# Patient Record
Sex: Male | Born: 1975 | Race: White | Hispanic: No | Marital: Married | Smoking: Never smoker
Health system: Southern US, Community
[De-identification: ages and names within clinical notes are randomized; demographics above are authoritative.]

## PROBLEM LIST (undated history)

## (undated) DIAGNOSIS — N200 Calculus of kidney: Secondary | ICD-10-CM

## (undated) HISTORY — PX: APPENDECTOMY: SHX54

## (undated) HISTORY — PX: LITHOTRIPSY: SUR834

---

## 2007-07-03 ENCOUNTER — Inpatient Hospital Stay (HOSPITAL_COMMUNITY): Admission: EM | Admit: 2007-07-03 | Discharge: 2007-07-04 | Payer: Self-pay | Admitting: Emergency Medicine

## 2007-07-03 ENCOUNTER — Encounter (INDEPENDENT_AMBULATORY_CARE_PROVIDER_SITE_OTHER): Payer: Self-pay | Admitting: General Surgery

## 2010-08-26 NOTE — Op Note (Signed)
Paul Sosa, Paul Sosa NO.:  [id number]   MEDICAL RECORD NO.:  [id number]          PATIENT TYPE:  INP   LOCATION:  0098                         FACILITY:  [facility]   PHYSICIAN:  Angelia Mould. Derrell Lolling, M.D.DATE OF BIRTH:  09-14-1975   DATE OF PROCEDURE:  07/03/2007  DATE OF DISCHARGE:                               OPERATIVE REPORT   PREOPERATIVE DIAGNOSIS:  Acute appendicitis.   POSTOPERATIVE DIAGNOSIS:  Acute appendicitis.   OPERATION PERFORMED:  Laparoscopic appendectomy.   SURGEON:  Angelia Mould. Derrell Lolling, M.D.   OPERATIVE INDICATIONS:  This is a 35 year old white man who presented to  the [facility] emergency room last night with a 2-Horger history of right  lower quadrant pain.  No nausea, no vomiting, no fever.  He had a  history of kidney stones in the past.  A CT scan was obtained which  showed acute appendicitis and no signs of kidney stones.  White blood  cell count is 15,000.  I was called to see him.  He was evaluated in the  emergency department and brought directly to the operating room.   OPERATIVE FINDINGS:  The appendix was acutely inflamed.  It was adherent  to the right lateral pelvic sidewall, and a loop of small bowel was  acutely adherent to this.  We were able to tease this off without any  problem, however.  The last 2 or 3 feet of terminal ileum looked normal.  The cecum, right colon, liver, gallbladder, otherwise looked fine.   OPERATIVE TECHNIQUE:  Following the induction of general endotracheal  anesthesia, the patient's abdomen was prepped and draped in a sterile  fashion.  Intravenous antibiotics had been given preop in the emergency  room.  The patient was identified as correct patient and correct  procedure.  0.5% Marcaine with epinephrine was used as a local  infiltration anesthetic.   A 12-mm optical port was placed in the left rectus sheath just above the  umbilicus.  This insertion was atraumatic.  Pneumoperitoneum was  created.  The  video camera was inserted, with visualization and findings  as described above.  A 5-mm trocar was placed in the left rectus sheath  just below the umbilicus, and a 12-mm trocar was placed in the  suprapubic area.  We slowly teased the terminal ileum off of the  appendix and were able to mobilize that back to the midline.  We were  able to identify the appendix, the appendiceal mesentery and the  junction of the appendix with the cecum.  We slowly teased the appendix  away from the right lateral pelvic sidewall, and we could then elevate  it with a grasper.  The appendiceal mesentery was then taken down using  the Harmonic scalpel.  We did this in several steps until we could  clearly identify the junction of the appendix with the cecum.  An Endo-  GIA stapling device was placed transversely across the base of the  appendix at the level of the cecum.  This was closed, held in place for  30 seconds, and then fired and  removed.  The appendix was placed in a  specimen bag and removed.  The operative field was irrigated with  saline.  The staple line looked very secure.  There was no bleeding.  We  irrigated out the pelvis and the right paracolic gutter just a little  bit and removed all the irrigation fluid.  The omentum was brought down  and placed over the staple line.  The trocars were removed under direct  vision.  There was no bleeding from trocar sites.  Pneumoperitoneum was  released.  The skin incisions were closed with subcuticular sutures of 4-  0 Monocryl and Dermabond.   Clean bandages were placed and the patient taken to the recovery room in  stable condition.  Estimated blood loss was about 10 mL.  Complications  none.  Sponge, needle and instrument counts were correct.      Angelia Mould. Derrell Lolling, M.D.  Electronically Signed     HMI/MEDQ  D:  07/03/2007  T:  07/03/2007  Job:  [id number]

## 2010-08-26 NOTE — H&P (Signed)
Paul Sosa, Paul Sosa NO.:  [id number]   MEDICAL RECORD NO.:  [id number]          PATIENT TYPE:  INP   LOCATION:  0098                         FACILITY:  [facility]   PHYSICIAN:  Angelia Mould. Derrell Lolling, M.D.DATE OF BIRTH:  [birth year]   DATE OF ADMISSION:  07/03/2007  DATE OF DISCHARGE:                              HISTORY & PHYSICAL   CHIEF COMPLAINT:  Right lower quadrant abdominal pain.   HISTORY OF PRESENT ILLNESS:  This is a 35 year old white man that I was  asked to see by Dr. Lorre Nick for evaluation of possible  appendicitis.   The patient came to the emergency room earlier this morning.  He had  been having right lower quadrant abdominal pain for 2 days.  This has  been getting gradually worse.  He denies nausea or vomiting.  He last  ate at 4 p.m. yesterday.  His bowel movements have been normal.  No  diarrhea.  No fever no chills.   Notable in his history is that 1 week ago he had a viral illness marked  by myalgias, chills and cough which now seems to have resolved.   Because of a history of kidney stones he had a noncontrast CT looking  for kidney stone, but there was no evidence of kidney stone or urologic  problem, but according to Dr. Lowella Dandy in radiology he clearly has  appendicitis with a small appendicolith and inflammatory changes around  the appendix but no evidence of rupture or abscess.  I was called to see  him and I have reviewed the CT scan with radiology.  He is being  admitted for management of his acute appendicitis.   PAST HISTORY:  Two episodes of kidney stones.  Wisdom teeth removed.  Otherwise, no medical or surgical problems.   MEDICATIONS:  None.   DRUG ALLERGIES:  None known.   FAMILY HISTORY:  Mother and father living and well.  Two brothers living  and well.   SOCIAL HISTORY:  He is married.  They have no children.  Denies the use  of tobacco.  Drinks alcohol occasionally.  He is currently unemployed,  having been laid off  from [employer] just last week.   REVIEW OF SYSTEMS:  A 15-system review of systems is performed and is  noncontributory except as described above.   PHYSICAL EXAMINATION:  Pleasant young man, somewhat overweight, in mild  distress.  Temperature 98.6, blood pressure 129/84, pulse 83,  respirations 18.  EYES:  Sclerae clear.  Extraocular movements intact.  EAR, NOSE, MOUTH AND THROAT:  Nose, lips, tongue and oropharynx are  without gross lesions.  NECK:  Supple, nontender.  No mass.  No jugular venous distention.  LUNGS:  Clear to auscultation.  I do not hear any rhonchi or wheezing.  No real chest wall tenderness.  HEART:  Regular rate and rhythm.  No murmur.  Radial and posterior  tibial pulses are palpable bilaterally.  No peripheral edema.  ABDOMEN:  He has localized tenderness and guarding in the right lower  quadrant.  Fairly soft elsewhere.  Liver and  spleen not enlarged.  No  hernias.  No masses.  GENITOURINARY:  No inguinal adenopathy or hernia.  EXTREMITIES:  Moves all four extremities well without pain or deformity.  NEUROLOGIC:  No gross motor or sensory deficits.   ADMISSION DATA:  CT scan reviewed and described above.  Urinalysis  clear.  CBC and chemistries are pending.   ASSESSMENT:  1. Acute appendicitis.  2. History of kidney stones.  3. History of recent viral upper respiratory infection, resolved.   PLAN:  1. The patient will be admitted to hospital, started on IV antibiotics      and taken the operating room for a laparoscopy and appendectomy.  2. I have discussed the indications and details of surgery with the      patient, his wife and mother.  Risks and complications have been      outlined, including but not limited to bleeding; infection;      conversion to open laparotomy; injury to adjacent organs such as      the bladder, ureter, intestine or major vascular structures with      major reconstructive surgery; wound problems such as infection  or      hernia; cardiac, pulmonary and thromboembolic problems.  He seems      to understand these issues well.  At this time all his questions      answered.  He is in full agreement with this plan.      Angelia Mould. Derrell Lolling, M.D.  Electronically Signed     HMI/MEDQ  D:  07/03/2007  T:  07/03/2007  Job:  [id number]

## 2011-01-05 LAB — URINALYSIS, ROUTINE W REFLEX MICROSCOPIC
Nitrite: NEGATIVE
pH: 7.5

## 2011-01-05 LAB — DIFFERENTIAL
Lymphocytes Relative: 13
Lymphs Abs: 2
Neutrophils Relative %: 78 — ABNORMAL HIGH

## 2011-01-05 LAB — CBC
HCT: 46.1
Platelets: 284
WBC: 15 — ABNORMAL HIGH

## 2011-01-05 LAB — BASIC METABOLIC PANEL
BUN: 7
Creatinine, Ser: 0.94
GFR calc Af Amer: >60
GFR calc non Af Amer: >60
Potassium: 3.7

## 2011-08-07 ENCOUNTER — Encounter (HOSPITAL_BASED_OUTPATIENT_CLINIC_OR_DEPARTMENT_OTHER): Payer: Self-pay | Admitting: *Deleted

## 2011-08-07 ENCOUNTER — Emergency Department (INDEPENDENT_AMBULATORY_CARE_PROVIDER_SITE_OTHER): Payer: Self-pay

## 2011-08-07 ENCOUNTER — Emergency Department (HOSPITAL_BASED_OUTPATIENT_CLINIC_OR_DEPARTMENT_OTHER)
Admission: EM | Admit: 2011-08-07 | Discharge: 2011-08-07 | Disposition: A | Discharge status: Home / Self Care | Payer: Self-pay | Attending: Emergency Medicine | Admitting: Emergency Medicine

## 2011-08-07 DIAGNOSIS — N201 Calculus of ureter: Secondary | ICD-10-CM

## 2011-08-07 DIAGNOSIS — R112 Nausea with vomiting, unspecified: Secondary | ICD-10-CM | POA: Insufficient documentation

## 2011-08-07 DIAGNOSIS — N23 Unspecified renal colic: Secondary | ICD-10-CM | POA: Insufficient documentation

## 2011-08-07 DIAGNOSIS — R109 Unspecified abdominal pain: Secondary | ICD-10-CM | POA: Insufficient documentation

## 2011-08-07 HISTORY — DX: Calculus of kidney: N20.0

## 2011-08-07 LAB — DIFFERENTIAL
Eosinophils Relative: 5 % (ref 0–5)
Lymphocytes Relative: 28 % (ref 12–46)
Lymphs Abs: 2.2 10*3/uL (ref 0.7–4.0)
Monocytes Absolute: 0.6 10*3/uL (ref 0.1–1.0)

## 2011-08-07 LAB — URINE MICROSCOPIC-ADD ON

## 2011-08-07 LAB — CBC
HCT: 45.1 % (ref 39.0–52.0)
MCV: 89.5 fL (ref 78.0–100.0)
RBC: 5.04 MIL/uL (ref 4.22–5.81)
WBC: 7.9 10*3/uL (ref 4.0–10.5)

## 2011-08-07 LAB — URINALYSIS, ROUTINE W REFLEX MICROSCOPIC
Glucose, UA: NEGATIVE mg/dL
Protein, ur: 100 mg/dL — AB

## 2011-08-07 LAB — BASIC METABOLIC PANEL
CO2: 28 mEq/L (ref 19–32)
Calcium: 9.2 mg/dL (ref 8.4–10.5)
Creatinine, Ser: 1.3 mg/dL (ref 0.50–1.35)
Glucose, Bld: 130 mg/dL — ABNORMAL HIGH (ref 70–99)

## 2011-08-07 MED ORDER — TAMSULOSIN HCL 0.4 MG PO CAPS: 0.4000 mg | ORAL_CAPSULE | Freq: Every day | ORAL | Status: AC

## 2011-08-07 MED ORDER — SODIUM CHLORIDE 0.9 % IV BOLUS (SEPSIS)
1000.0000 mL | Freq: Once | INTRAVENOUS | Status: AC
  Administered 2011-08-07: 1000 mL via INTRAVENOUS

## 2011-08-07 MED ORDER — IBUPROFEN 600 MG PO TABS: 600.0000 mg | ORAL_TABLET | Freq: Four times a day (QID) | ORAL | Status: DC | PRN

## 2011-08-07 MED ORDER — ONDANSETRON HCL 4 MG PO TABS: 4.0000 mg | ORAL_TABLET | Freq: Four times a day (QID) | ORAL | Status: AC

## 2011-08-07 MED ORDER — ONDANSETRON HCL 4 MG/2ML IJ SOLN
4.0000 mg | Freq: Once | INTRAMUSCULAR | Status: AC
  Administered 2011-08-07: 4 mg via INTRAVENOUS
  Filled 2011-08-07: qty 2

## 2011-08-07 MED ORDER — OXYCODONE-ACETAMINOPHEN 5-325 MG PO TABS: 1.0000 | ORAL_TABLET | ORAL | Status: AC | PRN

## 2011-08-07 MED ORDER — KETOROLAC TROMETHAMINE 30 MG/ML IJ SOLN
30.0000 mg | Freq: Once | INTRAMUSCULAR | Status: AC
  Administered 2011-08-07: 30 mg via INTRAVENOUS
  Filled 2011-08-07: qty 1

## 2011-08-07 NOTE — ED Notes (Signed)
Dr Yelverton at bedside.  

## 2011-08-07 NOTE — Discharge Instructions (Signed)
Kidney Stones Kidney stones (ureteral lithiasis) are deposits that form inside your kidneys. The intense pain is caused by the stone moving through the urinary tract. When the stone moves, the ureter goes into spasm around the stone. The stone is usually passed in the urine.  CAUSES   A disorder that makes certain neck glands produce too much parathyroid hormone (primary hyperparathyroidism).   A buildup of uric acid crystals.   Narrowing (stricture) of the ureter.   A kidney obstruction present at birth (congenital obstruction).   Previous surgery on the kidney or ureters.   Numerous kidney infections.  SYMPTOMS   Feeling sick to your stomach (nauseous).   Throwing up (vomiting).   Blood in the urine (hematuria).   Pain that usually spreads (radiates) to the groin.   Frequency or urgency of urination.  DIAGNOSIS   Taking a history and physical exam.   Blood or urine tests.   Computerized X-ray scan (CT scan).   Occasionally, an examination of the inside of the urinary bladder (cystoscopy) is performed.  TREATMENT   Observation.   Increasing your fluid intake.   Surgery may be needed if you have severe pain or persistent obstruction.  The size, location, and chemical composition are all important variables that will determine the proper choice of action for you. Talk to your caregiver to better understand your situation so that you will minimize the risk of injury to yourself and your kidney.  HOME CARE INSTRUCTIONS   Drink enough water and fluids to keep your urine clear or pale yellow.   Strain all urine through the provided strainer. Keep all particulate matter and stones for your caregiver to see. The stone causing the pain may be as small as a grain of salt. It is very important to use the strainer each and every time you pass your urine. The collection of your stone will allow your caregiver to analyze it and verify that a stone has actually passed.   Only take  over-the-counter or prescription medicines for pain, discomfort, or fever as directed by your caregiver.   Make a follow-up appointment with your caregiver as directed.   Get follow-up X-rays if required. The absence of pain does not always mean that the stone has passed. It may have only stopped moving. If the urine remains completely obstructed, it can cause loss of kidney function or even complete destruction of the kidney. It is your responsibility to make sure X-rays and follow-ups are completed. Ultrasounds of the kidney can show blockages and the status of the kidney. Ultrasounds are not associated with any radiation and can be performed easily in a matter of minutes.  SEEK IMMEDIATE MEDICAL CARE IF:   Pain cannot be controlled with the prescribed medicine.   You have a fever.   The severity or intensity of pain increases over 18 hours and is not relieved by pain medicine.   You develop a new onset of abdominal pain.   You feel faint or pass out.  MAKE SURE YOU:   Understand these instructions.   Will watch your condition.   Will get help right away if you are not doing well or get worse.  Document Released: 03/30/2005 Document Revised: 03/19/2011 Document Reviewed: 07/26/2009 Exit[employer] Patient Information 2012 [employer]. 

## 2011-08-07 NOTE — ED Notes (Signed)
Pt states he took his prescribed pain pill this evening without relief earlier, but states his pain is not as bad at this time.

## 2011-08-07 NOTE — ED Notes (Signed)
Pt has been dx'd with kidney stone a few weeks ago and passed one but now has abdominal pain on right side with vomiting

## 2011-08-07 NOTE — ED Provider Notes (Signed)
History     CSN: [id number]  Arrival date & time 08/07/11  0127   First MD Initiated Contact with Patient 08/07/11 0133      Chief Complaint  Patient presents with  . Abdominal Pain    (Consider location/radiation/quality/duration/timing/severity/associated sxs/prior treatment) HPI Pt p/w with several hours of R flank pain radiating to R groin. Pain is similar to prev renal stones. Pain now mostly resolved. No hematuria or fever. +nausea and 1 episode of vomiting.  Past Medical History  Diagnosis Date  . Kidney stones     Past Surgical History  Procedure Date  . Appendectomy     History reviewed. No pertinent family history.  History  Substance Use Topics  . Smoking status: Not on file  . Smokeless tobacco: Not on file  . Alcohol Use: No      Review of Systems  Constitutional: Negative for fever and chills.  Gastrointestinal: Positive for nausea, vomiting and abdominal pain.  Genitourinary: Positive for flank pain. Negative for dysuria, frequency and hematuria.  Skin: Positive for rash. Negative for wound.  Neurological: Negative for weakness and numbness.    Allergies  Review of patient's allergies indicates no known allergies.  Home Medications   Current Outpatient Rx  Name Route Sig Dispense Refill  . HYDROCODONE-ACETAMINOPHEN 5-325 MG PO TABS Oral Take 1 tablet by mouth every 6 (six) hours as needed.    . IBUPROFEN 600 MG PO TABS Oral Take 1 tablet (600 mg total) by mouth every 6 (six) hours as needed for pain. 30 tablet 0  . ONDANSETRON HCL 4 MG PO TABS Oral Take 1 tablet (4 mg total) by mouth every 6 (six) hours. 12 tablet 0  . OXYCODONE-ACETAMINOPHEN 5-325 MG PO TABS Oral Take 1 tablet by mouth every 4 (four) hours as needed for pain. 20 tablet 0  . TAMSULOSIN HCL 0.4 MG PO CAPS Oral Take 1 capsule (0.4 mg total) by mouth daily. 30 capsule 0    BP 123/77  Pulse 62  Temp(Src) 98.6 F (37 C) (Oral)  Resp 18  Ht 6\' 2"  (1.88 m)  Wt 240 lb (108.863  kg)  BMI 30.81 kg/m2  SpO2 100%  Physical Exam  Nursing note and vitals reviewed. Constitutional: He is oriented to person, place, and time. He appears well-developed and well-nourished. No distress.  HENT:  Head: Normocephalic and atraumatic.  Mouth/Throat: Oropharynx is clear and moist.  Eyes: EOM are normal. Pupils are equal, round, and reactive to light.  Neck: Normal range of motion. Neck supple.  Cardiovascular: Normal rate and regular rhythm.   Pulmonary/Chest: Effort normal and breath sounds normal. No respiratory distress. He has no wheezes. He has no rales.  Abdominal: Soft. Bowel sounds are normal. He exhibits no mass. There is no tenderness. There is no rebound and no guarding.  Musculoskeletal: Normal range of motion. He exhibits no edema and no tenderness.  Neurological: He is alert and oriented to person, place, and time.  Skin: Skin is warm and dry. No rash noted. No erythema.  Psychiatric: He has a normal mood and affect. His behavior is normal.    ED Course  Procedures (including critical care time)  Labs Reviewed  BASIC METABOLIC PANEL - Abnormal; Notable for the following:    Potassium 3.2 (*)    Glucose, Bld 130 (*)    GFR calc non Af Amer 70 (*)    GFR calc Af Amer 82 (*)    All other components within normal limits  URINALYSIS,  ROUTINE W REFLEX MICROSCOPIC - Abnormal; Notable for the following:    Color, Urine AMBER (*) BIOCHEMICALS MAY BE AFFECTED BY COLOR   APPearance CLOUDY (*)    Hgb urine dipstick LARGE (*)    Bilirubin Urine SMALL (*)    Ketones, ur 15 (*)    Protein, ur 100 (*)    All other components within normal limits  URINE MICROSCOPIC-ADD ON - Abnormal; Notable for the following:    Bacteria, UA FEW (*)    Crystals CA OXALATE CRYSTALS (*)    All other components within normal limits  CBC  DIFFERENTIAL   Ct Abdomen Pelvis Wo Contrast  08/07/2011  *RADIOLOGY REPORT*  Clinical Data: Right flank pain.  Previous renal stones.  CT ABDOMEN  AND PELVIS WITHOUT CONTRAST  Technique:  Multidetector CT imaging of the abdomen and pelvis was performed following the standard protocol without intravenous contrast.  Comparison: 07/03/2007  Findings: The lung bases are clear.  There is a 9 mm diameter stone in the right ureteropelvic junction with proximal pyelocaliectasis and mild periureteral stranding consistent with moderate obstruction.  The distal ureter is decompressed.  No left renal, left ureteral, or bladder stones visualized.  No pyelocaliectasis on the left.  The unenhanced appearance of the liver, spleen, gallbladder, pancreas, adrenal glands, abdominal aorta, and retroperitoneal lymph nodes is unremarkable.  The stomach and small bowel are decompressed.  Stool filled colon without distension.  No free air or free fluid in the abdomen.  Fat in the umbilical canal.  Pelvis:  Surgical absence of the appendix.  No free or loculated pelvic fluid collections.  The prostate gland is not enlarged.  No inflammatory changes in the sigmoid colon.  Normal alignment of the lumbar vertebrae.  IMPRESSION: 9 mm stone in the proximal right ureter with moderate obstruction.  Original Report Authenticated By: Marlon Pel, M.D.     1. Renal colic on right side       MDM   Pt counseled on CT results and need to f/u with urologist. Pt voiced understanding and will call in Am to arrange appointment. Will return to the ED for fever, chills, persistent pain, or any concerns. Remains pain-free in ED.         Loren Racer, MD 08/07/11 1610

## 2011-08-07 NOTE — ED Notes (Signed)
Pt transported to CT ?

## 2011-08-10 ENCOUNTER — Ambulatory Visit (HOSPITAL_COMMUNITY): Payer: Self-pay

## 2011-08-10 ENCOUNTER — Ambulatory Visit (HOSPITAL_COMMUNITY)
Admission: RE | Admit: 2011-08-10 | Discharge: 2011-08-10 | Disposition: A | Discharge status: Home / Self Care | Payer: Self-pay | Source: Ambulatory Visit | Attending: Urology | Admitting: Urology

## 2011-08-10 ENCOUNTER — Other Ambulatory Visit: Payer: Self-pay | Admitting: Urology

## 2011-08-10 ENCOUNTER — Encounter (HOSPITAL_COMMUNITY)
Admission: RE | Disposition: A | Discharge status: Home / Self Care | Payer: Self-pay | Source: Ambulatory Visit | Attending: Urology

## 2011-08-10 ENCOUNTER — Encounter (HOSPITAL_COMMUNITY): Payer: Self-pay | Admitting: *Deleted

## 2011-08-10 DIAGNOSIS — N201 Calculus of ureter: Secondary | ICD-10-CM | POA: Insufficient documentation

## 2011-08-10 DIAGNOSIS — N200 Calculus of kidney: Secondary | ICD-10-CM | POA: Insufficient documentation

## 2011-08-10 SURGERY — LITHOTRIPSY, ESWL
Anesthesia: LOCAL | Laterality: Right

## 2011-08-10 MED ORDER — CIPROFLOXACIN HCL 500 MG PO TABS
ORAL_TABLET | ORAL | Status: AC
  Administered 2011-08-10: 500 mg via ORAL
  Filled 2011-08-10: qty 1

## 2011-08-10 MED ORDER — DIPHENHYDRAMINE HCL 25 MG PO CAPS
ORAL_CAPSULE | ORAL | Status: AC
  Administered 2011-08-10: 25 mg via ORAL
  Filled 2011-08-10: qty 1

## 2011-08-10 MED ORDER — HYDROMORPHONE HCL PF 1 MG/ML IJ SOLN
INTRAMUSCULAR | Status: AC
  Administered 2011-08-10: 1 mg via INTRAVENOUS
  Filled 2011-08-10: qty 1

## 2011-08-10 MED ORDER — DEXTROSE-NACL 5-0.45 % IV SOLN
INTRAVENOUS | Status: DC
  Administered 2011-08-10: 18:00:00 via INTRAVENOUS

## 2011-08-10 MED ORDER — HYDROMORPHONE HCL PF 1 MG/ML IJ SOLN
1.0000 mg | Freq: Once | INTRAMUSCULAR | Status: AC
  Administered 2011-08-10: 1 mg via INTRAVENOUS

## 2011-08-10 MED ORDER — DIPHENHYDRAMINE HCL 25 MG PO CAPS
25.0000 mg | ORAL_CAPSULE | ORAL | Status: AC
  Administered 2011-08-10: 25 mg via ORAL

## 2011-08-10 MED ORDER — DIAZEPAM 5 MG PO TABS
10.0000 mg | ORAL_TABLET | ORAL | Status: AC
  Administered 2011-08-10: 10 mg via ORAL

## 2011-08-10 MED ORDER — CIPROFLOXACIN HCL 500 MG PO TABS
500.0000 mg | ORAL_TABLET | ORAL | Status: AC
  Administered 2011-08-10: 500 mg via ORAL

## 2011-08-10 MED ORDER — DIAZEPAM 5 MG PO TABS
ORAL_TABLET | ORAL | Status: AC
  Filled 2011-08-10: qty 2

## 2011-08-10 NOTE — Discharge Instructions (Signed)
Lithotripsy Care After Refer to this sheet for the next few weeks. These discharge instructions provide you with general information on caring for yourself after you leave the hospital. Your caregiver may also give you specific instructions. Your treatment has been planned according to the most current medical practices available, but unavoidable complications sometimes occur. If you have any problems or questions after discharge, please call your caregiver. AFTER THE PROCEDURE   The recovery time will vary with the procedure done.   You will be taken to the recovery area. A nurse will watch and check your progress. Once you are awake, stable, and taking fluids well, you will be allowed to go home as long as there are no problems.   Your urine may have a red tinge for a few days after treatment. Blood loss is usually minimal.   You may have soreness in the back or flank area. This usually goes away after a few days. The procedure can cause blotches or bruises on the back where the pressure wave enters the skin. These marks usually cause only minimal discomfort and should disappear in a short time.   Stone fragments should begin to pass within 24 hours of treatment. However, a delayed passage is not unusual.   You may have pain, discomfort, and feel sick to your stomach (nauseous) when the crushed (pulverized) fragments of stone are passed down the tube from the kidney to the bladder. Stone fragments can pass soon after the procedure and may last for up to 4 to 8 weeks.   A small number of patients may have severe pain when stone fragments are not able to pass, which leads to an obstruction.   If your stone is greater than 1 inch/2.5 centimeters in diameter or if you have multiple stones that have a combined diameter greater than 1 inch/2.5 centimeters, you may require more than 1 treatment.   You must have someone drive you home.  HOME CARE INSTRUCTIONS   Rest at home until you feel your  energy improving.   Only take over-the-counter or prescription medicines for pain, discomfort, or fever as directed by your caregiver. Depending on the type of lithotripsy, you may need to take medicines (antibiotics) that kill germs and anti-inflammatory medicines for a few days.   Drink enough water and fluids to keep your urine clear or pale yellow. This helps "flush" your kidneys. It helps pass any remaining pieces of stone and prevents stones from coming back.   Most people can resume daily activities within 1 or 2 days after standard lithotripsy. It can take longer to recover from laser and percutaneous lithotripsy.   If the stones are in your urinary system, you may be asked to strain your urine at home to look for stones. Any stones that are found can be sent to a medical lab for examination.   Visit your caregiver for a follow-up appointment in a few weeks. Your doctor may remove your stent if you have one. Your caregiver will also check to see whether stone particles still remain.  SEEK MEDICAL CARE IF:   You have an oral temperature above 102 F (38.9 C).   Your pain is not relieved by medicine.   You have a lasting nauseous feeling.   You feel there is too much blood in the urine.   You develop persistent problems with frequent and/or painful urination that does not at least partially improve after 2 days following the procedure.   You have a congested   cough.   You feel lightheaded.   You develop a rash or any other signs that might suggest an allergic problem.   You develop any reaction or side effects to your medicine(s).  SEEK IMMEDIATE MEDICAL CARE IF:   You experience severe back and/or flank pain.   You see nothing but blood when you urinate.   You cannot pass any urine at all.   You have an oral temperature above 102 F (38.9 C), not controlled by medicine.   You develop shortness of breath, difficulty breathing, or chest pain.   You develop vomiting  that will not stop after 6 to 8 hours.   You have a fainting episode.  MAKE SURE YOU:   Understand these instructions.   Will watch your condition.   Will get help right away if you are not doing well or get worse.  Document Released: 04/19/2007 Document Revised: 03/19/2011 Document Reviewed: 04/19/2007 [employer] Patient Information 2012 [employer]. 

## 2011-08-10 NOTE — Interval H&P Note (Signed)
History and Physical Interval Note:  08/10/2011 6:52 PM  Paul Sosa  has presented today for surgery, with the diagnosis of rt upj stone  The various methods of treatment have been discussed with the patient and family. After consideration of risks, benefits and other options for treatment, the patient has consented to  Procedure(s) (LRB): EXTRACORPOREAL SHOCK WAVE LITHOTRIPSY (ESWL) (Right) as a surgical intervention .  The patients' history has been reviewed, patient examined, no change in status, stable for surgery.  I have reviewed the patients' chart and labs.  Questions were answered to the patient's satisfaction.     [name]

## 2011-08-10 NOTE — H&P (Signed)
ms Problems  1. Proximal Ureteral Stone On The Right 592.1  History of Present Illness  Paul Sosa is a 36 yo WM who had the onset of pain in the RLQ on Thursday night. The pain became severe and he went to the ER where he was found to have a 9mm right ureteral stone.  He continues to have pain and is having some now.   He had a stone in 1999 that he passed.  He had no gross hematuria.  He has had no voiding symptoms.   Past Medical History Problems  1. History of  Nephrolithiasis V13.01  Surgical History Problems  1. History of  Appendectomy  Allergies Medication  1. No Known Drug Allergies  Family History Problems  1. Family history of  Family Health Status - Father's Age age 48 2. Family history of  Family Health Status - Mother's Age age 47 3. 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  Genitourinary: nocturia.  Gastrointestinal: nausea and abdominal pain.    Vitals Vital Signs [Data Includes: Last 1 Edelstein]  29Apr2013 08:52AM  BMI Calculated: 28.01 BSA Calculated: 2.35 Height: 6 ft 4 in Weight: 230 lb  Blood Pressure: 131 / 89 Temperature: 98.3 F Heart Rate: 65  Physical Exam Constitutional: Well nourished and well developed . No acute distress.  ENT:. The ears and nose are normal in appearance.  Neck: The appearance of the neck is normal and no neck mass is present.  Pulmonary: No respiratory distress and normal respiratory rhythm and effort.  Cardiovascular: Heart  rate and rhythm are normal . No peripheral edema.  Abdomen: No masses are palpated. Moderate tenderness in the RLQ is present. mild right CVA tenderness. No hernias are palpable. No hepatosplenomegaly noted.  Lymphatics: The supraclavicular nodes are not enlarged or tender.  Skin: Normal skin turgor, no visible rash and no visible skin lesions.  Neuro/Psych:. Mood and affect are appropriate.    Results/Data Urine [Data Includes: Last 1 Debord]   29Apr2013  COLOR YELLOW   APPEARANCE CLEAR   SPECIFIC GRAVITY 1.020   pH 6.0   GLUCOSE NEG mg/dL  BILIRUBIN SMALL   KETONE > 80 mg/dL  BLOOD SMALL   PROTEIN NEG mg/dL  UROBILINOGEN 0.2 mg/dL  NITRITE NEG   LEUKOCYTE ESTERASE TRACE   SQUAMOUS EPITHELIAL/HPF NONE SEEN   WBC 4-6 WBC/hpf  RBC 0-3 RBC/hpf  BACTERIA NONE SEEN   CRYSTALS NONE SEEN   CASTS NONE SEEN    The following images/tracing/specimen were independently visualized:  I have reviewed his CT films and report.    Assessment Assessed  1. Proximal Ureteral Stone On The Right 592.1   He has a 9mm symptomatic RUPJ stone and needs therapy.   Plan Health Maintenance (V70.0)  1. UA With REFLEX  Done: 29Apr2013 08:34AM Proximal Ureteral Stone On The Right (592.1)  2. Oxycodone-Acetaminophen 5-325 MG Oral Tablet; take 1 or 2 tablets q 4-6 hours prn pain;  Therapy: 29Apr2013 to (Last Rx:29Apr2013) 3. Follow-up Schedule Surgery Office  Follow-up  Requested for: 29Apr2013  I discussed ureteroscopy and ESWL and will set him up for ESWL.  I reviewed the risks of bleeding, infection, failure of fragmentation or obstructing fragments requiring secondary procedures,  injury to the kidney or adjacent structures, arrhythmias and sedation side effects.

## 2011-09-10 ENCOUNTER — Emergency Department (HOSPITAL_BASED_OUTPATIENT_CLINIC_OR_DEPARTMENT_OTHER)
Admission: EM | Admit: 2011-09-10 | Discharge: 2011-09-10 | Disposition: A | Discharge status: Home / Self Care | Payer: Self-pay | Attending: Emergency Medicine | Admitting: Emergency Medicine

## 2011-09-10 ENCOUNTER — Encounter (HOSPITAL_BASED_OUTPATIENT_CLINIC_OR_DEPARTMENT_OTHER): Payer: Self-pay | Admitting: Emergency Medicine

## 2011-09-10 DIAGNOSIS — R10813 Right lower quadrant abdominal tenderness: Secondary | ICD-10-CM | POA: Insufficient documentation

## 2011-09-10 DIAGNOSIS — R109 Unspecified abdominal pain: Secondary | ICD-10-CM | POA: Insufficient documentation

## 2011-09-10 DIAGNOSIS — F411 Generalized anxiety disorder: Secondary | ICD-10-CM | POA: Insufficient documentation

## 2011-09-10 DIAGNOSIS — N23 Unspecified renal colic: Secondary | ICD-10-CM | POA: Insufficient documentation

## 2011-09-10 HISTORY — DX: Calculus of kidney: N20.0

## 2011-09-10 LAB — URINE MICROSCOPIC-ADD ON

## 2011-09-10 LAB — URINALYSIS, ROUTINE W REFLEX MICROSCOPIC
Ketones, ur: 15 mg/dL — AB
Nitrite: NEGATIVE
Specific Gravity, Urine: 1.031 — ABNORMAL HIGH (ref 1.005–1.030)
pH: 6.5 (ref 5.0–8.0)

## 2011-09-10 MED ORDER — HYDROMORPHONE HCL 4 MG PO TABS: 2.0000 mg | ORAL_TABLET | ORAL | Status: AC | PRN

## 2011-09-10 MED ORDER — KETOROLAC TROMETHAMINE 15 MG/ML IJ SOLN
15.0000 mg | Freq: Once | INTRAMUSCULAR | Status: AC
Start: 2011-09-10 — End: 2011-09-10
  Administered 2011-09-10: 15 mg via INTRAVENOUS
  Filled 2011-09-10: qty 1

## 2011-09-10 MED ORDER — ONDANSETRON 8 MG PO TBDP: 8.0000 mg | ORAL_TABLET | Freq: Three times a day (TID) | ORAL | Status: AC | PRN

## 2011-09-10 MED ORDER — SODIUM CHLORIDE 0.9 % IV SOLN
INTRAVENOUS | Status: DC
  Administered 2011-09-10: 02:00:00 via INTRAVENOUS

## 2011-09-10 MED ORDER — ONDANSETRON HCL 4 MG/2ML IJ SOLN
4.0000 mg | Freq: Once | INTRAMUSCULAR | Status: AC
  Administered 2011-09-10: 4 mg via INTRAVENOUS
  Filled 2011-09-10: qty 2

## 2011-09-10 MED ORDER — HYDROMORPHONE HCL PF 1 MG/ML IJ SOLN
1.0000 mg | Freq: Once | INTRAMUSCULAR | Status: AC
Start: 2011-09-10 — End: 2011-09-10
  Administered 2011-09-10: 1 mg via INTRAVENOUS
  Filled 2011-09-10: qty 1

## 2011-09-10 MED ORDER — NAPROXEN SODIUM 220 MG PO TABS: ORAL_TABLET | ORAL | Status: AC

## 2011-09-10 MED ORDER — NAPROXEN 250 MG PO TABS
500.0000 mg | ORAL_TABLET | Freq: Once | ORAL | Status: AC
Start: 2011-09-10 — End: 2011-09-10
  Administered 2011-09-10: 500 mg via ORAL
  Filled 2011-09-10: qty 2

## 2011-09-10 NOTE — ED Notes (Signed)
Pt reports having right flank pain around 10pm last night. States that he has a history of kidney stones and thinks he's passing one. Pt reports nausea and vomiting with the flank pain. Pt reports flank pain 10/10.

## 2011-09-10 NOTE — ED Notes (Signed)
pt c/o right sided flank pain onset 10pm

## 2011-09-10 NOTE — ED Provider Notes (Signed)
History     CSN: [id number]  Arrival date & time 09/10/11  0134   First MD Initiated Contact with Patient 09/10/11 0148      Chief Complaint  Patient presents with  . Flank Pain    (Consider location/radiation/quality/duration/timing/severity/associated sxs/prior treatment) HPI This is a 36 year old white male with a history of kidney stones. He had a kidney stone about a month ago for which he is surgical intervention. He is here with a four-hour history of right flank pain radiating to the right lower quadrant. It is characterized as being like previous kidney stones. The pain is severe. It is not relieved by anything. It is been accompanied by nausea, vomiting and hematuria.  He is also being treated for poison oak of the right forearm. He also has petechiae, most notably on his chin, as a result of forceful vomiting earlier.  Past Medical History  Diagnosis Date  . Kidney stones   . Kidney stone     Past Surgical History  Procedure Date  . Appendectomy   . Lithotripsy     No family history on file.  History  Substance Use Topics  . Smoking status: Never Smoker   . Smokeless tobacco: Not on file  . Alcohol Use: No      Review of Systems  All other systems reviewed and are negative.    Allergies  Review of patient's allergies indicates no known allergies.  Home Medications   Current Outpatient Rx  Name Route Sig Dispense Refill  . TAMSULOSIN HCL 0.4 MG PO CAPS Oral Take 1 capsule (0.4 mg total) by mouth daily. 30 capsule 0    BP 133/89  Pulse 64  Temp(Src) 97.4 F (36.3 C) (Oral)  Resp 18  SpO2 98%  Physical Exam General: Well-developed, well-nourished male in apparent discomfort; appearance consistent with age of record HENT: normocephalic, atraumatic Eyes: pupils equal round and reactive to light; extraocular muscles intact Neck: supple Heart: regular rate and rhythm Lungs: clear to auscultation bilaterally Abdomen: soft; nondistended; right  lower quadrant tenderness; no masses or hepatosplenomegaly; bowel sounds present Extremities: No deformity; full range of motion Neurologic: Awake, alert and oriented; motor function intact in all extremities and symmetric; no facial droop Skin: Warm and dry; facial petechiae, confluent on chin; vesicular rash right forearm  Psychiatric: Anxious    ED Course  Procedures (including critical care time)     MDM   Nursing notes and vitals signs, including pulse oximetry, reviewed.  Summary of this visit's results, reviewed by myself:  Labs:  Results for orders placed during the hospital encounter of 09/10/11  URINALYSIS, ROUTINE W REFLEX MICROSCOPIC      Component Value Range   Color, Urine AMBER (*) YELLOW    APPearance CLEAR  CLEAR    Specific Gravity, Urine 1.031 (*) 1.005 - 1.030    pH 6.5  5.0 - 8.0    Glucose, UA NEGATIVE  NEGATIVE (mg/dL)   Hgb urine dipstick LARGE (*) NEGATIVE    Bilirubin Urine SMALL (*) NEGATIVE    Ketones, ur 15 (*) NEGATIVE (mg/dL)   Protein, ur 30 (*) NEGATIVE (mg/dL)   Urobilinogen, UA 0.2  0.0 - 1.0 (mg/dL)   Nitrite NEGATIVE  NEGATIVE    Leukocytes, UA NEGATIVE  NEGATIVE   URINE MICROSCOPIC-ADD ON      Component Value Range   Squamous Epithelial / LPF RARE  RARE    WBC, UA 0-2  <3 (WBC/hpf)   RBC / HPF 11-20  <3 (RBC/hpf)  Bacteria, UA RARE  RARE    Crystals CA OXALATE CRYSTALS (*) NEGATIVE    Urine-Other MUCOUS PRESENT      Imaging Studies: No results found.  2:43 AM Pain relieved after IV medications. Will not repeat his CT scan as he had one only a month ago. He will followup with Dr. Annabell Howells.        Hanley Seamen, MD 09/10/11 [phone]

## 2011-09-10 NOTE — Discharge Instructions (Signed)
Ureteral Colic (Kidney Stones) Ureteral colic is the result of a condition when kidney stones form inside the kidney. Once kidney stones are formed they may move into the tube that connects the kidney with the bladder (ureter). If this occurs, this condition may cause pain (colic) in the ureter.  CAUSES  Pain is caused by stone movement in the ureter and the obstruction caused by the stone. SYMPTOMS  The pain comes and goes as the ureter contracts around the stone. The pain is usually intense, sharp, and stabbing in character. The location of the pain may move as the stone moves through the ureter. When the stone is near the kidney the pain is usually located in the back and radiates to the belly (abdomen). When the stone is ready to pass into the bladder the pain is often located in the lower abdomen on the side the stone is located. At this location, the symptoms may mimic those of a urinary tract infection with urinary frequency. Once the stone is located here it often passes into the bladder and the pain disappears completely. TREATMENT   Your caregiver will provide you with medicine for pain relief.   You may require specialized follow-up X-rays.   The absence of pain does not always mean that the stone has passed. It may have just stopped moving. If the urine remains completely obstructed, it can cause loss of kidney function or even complete destruction of the involved kidney. It is your responsibility and in your interest that X-rays and follow-ups as suggested by your caregiver are completed. Relief of pain without passage of the stone can be associated with severe damage to the kidney, including loss of kidney function on that side.   If your stone does not pass on its own, additional measures may be taken by your caregiver to ensure its removal.  HOME CARE INSTRUCTIONS   Increase your fluid intake. Water is the preferred fluid since juices containing vitamin C may acidify the urine making  it less likely for certain stones (uric acid stones) to pass.   Strain all urine. A strainer will be provided. Keep all particulate matter or stones for your caregiver to inspect.   Take your pain medicine as directed.   Make a follow-up appointment with your caregiver as directed.   Remember that the goal is passage of your stone. The absence of pain does not mean the stone is gone. Follow your caregiver's instructions.   Only take over-the-counter or prescription medicines for pain, discomfort, or fever as directed by your caregiver.  SEEK MEDICAL CARE IF:   Pain cannot be controlled with the prescribed medicine.   You have a fever.   Pain continues for longer than your caregiver advises it should.   There is a change in the pain, and you develop chest discomfort or constant abdominal pain.   You feel faint or pass out.  MAKE SURE YOU:   Understand these instructions.   Will watch your condition.   Will get help right away if you are not doing well or get worse.  Document Released: 01/07/2005 Document Revised: 03/19/2011 Document Reviewed: 09/24/2010 Exit[employer] Patient Information 2012 [employer]. 

## 2014-08-18 ENCOUNTER — Encounter (HOSPITAL_COMMUNITY): Payer: Self-pay | Admitting: *Deleted

## 2014-08-18 ENCOUNTER — Emergency Department (HOSPITAL_COMMUNITY)
Admission: EM | Admit: 2014-08-18 | Discharge: 2014-08-18 | Disposition: A | Discharge status: Home / Self Care | Payer: Self-pay | Attending: Emergency Medicine | Admitting: Emergency Medicine

## 2014-08-18 ENCOUNTER — Emergency Department (HOSPITAL_COMMUNITY): Payer: Self-pay

## 2014-08-18 DIAGNOSIS — Z791 Long term (current) use of non-steroidal anti-inflammatories (NSAID): Secondary | ICD-10-CM | POA: Insufficient documentation

## 2014-08-18 DIAGNOSIS — R51 Headache: Secondary | ICD-10-CM | POA: Insufficient documentation

## 2014-08-18 DIAGNOSIS — J0110 Acute frontal sinusitis, unspecified: Secondary | ICD-10-CM | POA: Insufficient documentation

## 2014-08-18 DIAGNOSIS — R519 Headache, unspecified: Secondary | ICD-10-CM

## 2014-08-18 DIAGNOSIS — Z87442 Personal history of urinary calculi: Secondary | ICD-10-CM | POA: Insufficient documentation

## 2014-08-18 DIAGNOSIS — Z79899 Other long term (current) drug therapy: Secondary | ICD-10-CM | POA: Insufficient documentation

## 2014-08-18 MED ORDER — DEXAMETHASONE SODIUM PHOSPHATE 10 MG/ML IJ SOLN
10.0000 mg | Freq: Once | INTRAMUSCULAR | Status: AC
  Administered 2014-08-18: 10 mg via INTRAVENOUS
  Filled 2014-08-18: qty 1

## 2014-08-18 MED ORDER — DIPHENHYDRAMINE HCL 50 MG/ML IJ SOLN
25.0000 mg | Freq: Once | INTRAMUSCULAR | Status: AC
  Administered 2014-08-18: 25 mg via INTRAVENOUS
  Filled 2014-08-18: qty 1

## 2014-08-18 MED ORDER — KETOROLAC TROMETHAMINE 30 MG/ML IJ SOLN: 30.0000 mg | Freq: Once | INTRAMUSCULAR | Status: DC

## 2014-08-18 MED ORDER — METOCLOPRAMIDE HCL 5 MG/ML IJ SOLN
10.0000 mg | Freq: Once | INTRAMUSCULAR | Status: AC
  Administered 2014-08-18: 10 mg via INTRAVENOUS
  Filled 2014-08-18: qty 2

## 2014-08-18 MED ORDER — AZITHROMYCIN 250 MG PO TABS: 250.0000 mg | ORAL_TABLET | Freq: Every day | ORAL | Status: AC

## 2014-08-18 MED ORDER — SODIUM CHLORIDE 0.9 % IV BOLUS (SEPSIS)
500.0000 mL | Freq: Once | INTRAVENOUS | Status: AC
  Administered 2014-08-18: 500 mL via INTRAVENOUS

## 2014-08-18 NOTE — ED Notes (Signed)
Pt A&OX4, ambulatory at d/c with steady gait, NAD, thanking staff for his care. 

## 2014-08-18 NOTE — ED Provider Notes (Signed)
CSN: [id number]     Arrival date & time 08/18/14  1239 History   First MD Initiated Contact with Patient 08/18/14 1427     Chief Complaint  Patient presents with  . Headache     (Consider location/radiation/quality/duration/timing/severity/associated sxs/prior Treatment) HPI Comments: Patient presents today with a chief complaint of headache.  She reports a left sided frontal headache that has been present for the past 4-5 days.  He states that the headache was gradual in onset.  At times the headache becomes worse and at times improves.  He states that the headache worsened after donating plasma this morning.  Headache associated with some photosensitivity, but no changes in vision.  He has taken Advil for the headache with minimal relief.  He denies history of migraines.  He states that he has known several people that have been diagnosed with brain tumors recently and is very concerned that he may have a brain tumor.  He denies nausea, vomiting, numbness, tingling, focal weakness, dizziness, ataxia, fever, chills, or neck pain/stiffness.  He denies any head injury or trauma.    The history is provided by the patient.    Past Medical History  Diagnosis Date  . Kidney stones   . Kidney stone    Past Surgical History  Procedure Laterality Date  . Appendectomy    . Lithotripsy     History reviewed. No pertinent family history. History  Substance Use Topics  . Smoking status: Never Smoker   . Smokeless tobacco: Not on file  . Alcohol Use: No    Review of Systems  All other systems reviewed and are negative.     Allergies  Review of patient's allergies indicates no known allergies.  Home Medications   Prior to Admission medications   Medication Sig Start Date End Date Taking? Authorizing Provider  ibuprofen (ADVIL,MOTRIN) 200 MG tablet Take 400 mg by mouth every 6 (six) hours as needed for mild pain.   Yes Historical Provider, MD  naproxen sodium (ALEVE) 220 MG tablet Take  2 tablets twice daily until stone passes. Patient not taking: Reported on 08/18/2014 09/10/11   Paula LibraJohn Molpus, MD  Tamsulosin HCl (FLOMAX) 0.4 MG CAPS Take 1 capsule (0.4 mg total) by mouth daily. Patient not taking: Reported on 08/18/2014 08/07/11   Loren Raceravid Yelverton, MD   BP 144/95 mmHg  Pulse 68  Temp(Src) 97.4 F (36.3 C) (Oral)  Resp 17  SpO2 99% Physical Exam  Constitutional: He appears well-developed and well-nourished.  HENT:  Head: Normocephalic and atraumatic.  Mouth/Throat: Oropharynx is clear and moist.  Eyes: EOM are normal. Pupils are equal, round, and reactive to light.  Neck: Normal range of motion. Neck supple.  Cardiovascular: Normal rate, regular rhythm and normal heart sounds.   Pulmonary/Chest: Effort normal and breath sounds normal.  Musculoskeletal: Normal range of motion.  Neurological: He is alert. He has normal strength. No cranial nerve deficit or sensory deficit. Coordination and gait normal.  Normal gait, no ataxia Normal finger to nose testing Normal rapid alternating movements  Skin: Skin is warm and dry.  Psychiatric: He has a normal mood and affect.  Nursing note and vitals reviewed.   ED Course  Procedures (including critical care time) Labs Review Labs Reviewed - No data to display  Imaging Review Ct Head Wo Contrast  08/18/2014   CLINICAL DATA:  Left-sided headache for 3-4 days  EXAM: CT HEAD WITHOUT CONTRAST  TECHNIQUE: Contiguous axial images were obtained from the base of the skull through  the vertex without intravenous contrast.  COMPARISON:  None.  FINDINGS:  .: FINDINGS:  . Thebony calvarium is intact. The paranasal sinuses and mastoid air cells are well aerated with the exception of an air-fluid level within the left frontal sinus. This may be the etiology of patient's underlying headaches. No findings to suggest acute hemorrhage, acute infarction or space-occupying mass lesion are noted.  IMPRESSION: No acute intracranial abnormality is noted   Air-fluid level consistent with acute sinusitis within the left frontal lobe. This is likely the etiology of the patient's underlying headaches.   Electronically Signed   By: Alcide CleverMark  Lukens M.D.   On: 08/18/2014 15:19     EKG Interpretation None     4:00 PM Reassessed patient.  He reports that his headache has significantly improved at this time. MDM   Final diagnoses:  None   Patient presents today with headache that has been present for 4-5 days.  He has a normal neurological exam.   He is afebrile with no nuchal rigidity.  CT head is negative aside from a Sinusitis.  Headache improved in the ED after given Migraine Cocktail.  Feel that the patient is stable for discharge.  Return precautions given.    Santiago GladHeather Manolito Jurewicz, PA-C 08/19/14 [id number]  Gerhard Munchobert Lockwood, MD 08/19/14 [phone]

## 2014-08-18 NOTE — ED Notes (Signed)
Pt reports giving plasma this AM with worsening pain. Pt reports taking 2 advil at 1200 today with no relief.

## 2014-08-18 NOTE — ED Notes (Signed)
Pt reports left side headache x 3-4 days. Having sensitivity to light, denies n/v. Denies hx of migraines.

## 2014-08-18 NOTE — ED Notes (Signed)
Pt A&OX4, ambulatory at d/c with steady gait, NAD 

## 2014-11-26 ENCOUNTER — Emergency Department (HOSPITAL_BASED_OUTPATIENT_CLINIC_OR_DEPARTMENT_OTHER): Payer: No Typology Code available for payment source

## 2014-11-26 ENCOUNTER — Encounter (HOSPITAL_BASED_OUTPATIENT_CLINIC_OR_DEPARTMENT_OTHER): Payer: Self-pay | Admitting: *Deleted

## 2014-11-26 ENCOUNTER — Emergency Department (HOSPITAL_BASED_OUTPATIENT_CLINIC_OR_DEPARTMENT_OTHER)
Admission: EM | Admit: 2014-11-26 | Discharge: 2014-11-26 | Disposition: A | Discharge status: Home / Self Care | Payer: No Typology Code available for payment source | Attending: Emergency Medicine | Admitting: Emergency Medicine

## 2014-11-26 DIAGNOSIS — S3992XA Unspecified injury of lower back, initial encounter: Secondary | ICD-10-CM | POA: Insufficient documentation

## 2014-11-26 DIAGNOSIS — S301XXA Contusion of abdominal wall, initial encounter: Secondary | ICD-10-CM | POA: Diagnosis not present

## 2014-11-26 DIAGNOSIS — Z87442 Personal history of urinary calculi: Secondary | ICD-10-CM | POA: Insufficient documentation

## 2014-11-26 DIAGNOSIS — Y92410 Unspecified street and highway as the place of occurrence of the external cause: Secondary | ICD-10-CM | POA: Diagnosis not present

## 2014-11-26 DIAGNOSIS — S20219A Contusion of unspecified front wall of thorax, initial encounter: Secondary | ICD-10-CM | POA: Diagnosis not present

## 2014-11-26 DIAGNOSIS — S6992XA Unspecified injury of left wrist, hand and finger(s), initial encounter: Secondary | ICD-10-CM | POA: Diagnosis present

## 2014-11-26 DIAGNOSIS — Y998 Other external cause status: Secondary | ICD-10-CM | POA: Insufficient documentation

## 2014-11-26 DIAGNOSIS — Y9389 Activity, other specified: Secondary | ICD-10-CM | POA: Diagnosis not present

## 2014-11-26 DIAGNOSIS — S52502A Unspecified fracture of the lower end of left radius, initial encounter for closed fracture: Secondary | ICD-10-CM | POA: Diagnosis not present

## 2014-11-26 LAB — URINALYSIS, ROUTINE W REFLEX MICROSCOPIC
Bilirubin Urine: NEGATIVE
Glucose, UA: NEGATIVE mg/dL
Hgb urine dipstick: NEGATIVE
KETONES UR: NEGATIVE mg/dL
LEUKOCYTES UA: NEGATIVE
NITRITE: NEGATIVE
PROTEIN: NEGATIVE mg/dL
Specific Gravity, Urine: 1.019 (ref 1.005–1.030)
UROBILINOGEN UA: 0.2 mg/dL (ref 0.0–1.0)
pH: 6 (ref 5.0–8.0)

## 2014-11-26 LAB — COMPREHENSIVE METABOLIC PANEL
ALBUMIN: 3.8 g/dL (ref 3.5–5.0)
ALT: 19 U/L (ref 17–63)
ANION GAP: 8 (ref 5–15)
AST: 21 U/L (ref 15–41)
Alkaline Phosphatase: 59 U/L (ref 38–126)
BILIRUBIN TOTAL: 0.4 mg/dL (ref 0.3–1.2)
BUN: 10 mg/dL (ref 6–20)
CO2: 27 mmol/L (ref 22–32)
Calcium: 8.6 mg/dL — ABNORMAL LOW (ref 8.9–10.3)
Chloride: 106 mmol/L (ref 101–111)
Creatinine, Ser: 1.1 mg/dL (ref 0.61–1.24)
GLUCOSE: 86 mg/dL (ref 65–99)
POTASSIUM: 3.7 mmol/L (ref 3.5–5.1)
Sodium: 141 mmol/L (ref 135–145)
TOTAL PROTEIN: 6.5 g/dL (ref 6.5–8.1)

## 2014-11-26 LAB — CBC WITH DIFFERENTIAL/PLATELET
BASOS PCT: 1 % (ref 0–1)
Basophils Absolute: 0.1 10*3/uL (ref 0.0–0.1)
Eosinophils Absolute: 0.6 10*3/uL (ref 0.0–0.7)
Eosinophils Relative: 9 % — ABNORMAL HIGH (ref 0–5)
HEMATOCRIT: 41.6 % (ref 39.0–52.0)
Hemoglobin: 13.7 g/dL (ref 13.0–17.0)
Lymphocytes Relative: 33 % (ref 12–46)
Lymphs Abs: 2.4 10*3/uL (ref 0.7–4.0)
MCH: 30.5 pg (ref 26.0–34.0)
MCHC: 32.9 g/dL (ref 30.0–36.0)
MCV: 92.7 fL (ref 78.0–100.0)
MONO ABS: 0.7 10*3/uL (ref 0.1–1.0)
MONOS PCT: 9 % (ref 3–12)
NEUTROS ABS: 3.6 10*3/uL (ref 1.7–7.7)
Neutrophils Relative %: 48 % (ref 43–77)
Platelets: 180 10*3/uL (ref 150–400)
RBC: 4.49 MIL/uL (ref 4.22–5.81)
RDW: 12.7 % (ref 11.5–15.5)
WBC: 7.4 10*3/uL (ref 4.0–10.5)

## 2014-11-26 MED ORDER — OXYCODONE-ACETAMINOPHEN 5-325 MG PO TABS: 1.0000 | ORAL_TABLET | ORAL | Status: AC | PRN

## 2014-11-26 MED ORDER — IOHEXOL 300 MG/ML  SOLN
100.0000 mL | Freq: Once | INTRAMUSCULAR | Status: AC | PRN
Start: 2014-11-26 — End: 2014-11-26
  Administered 2014-11-26: 100 mL via INTRAVENOUS

## 2014-11-26 NOTE — Discharge Instructions (Signed)
Wrist Fracture A wrist fracture is a break or crack in one of the bones of your wrist. Your wrist is made up of eight small bones at the palm of your hand (carpal bones) and two long bones that make up your forearm (radius and ulna).  CAUSES   A direct blow to the wrist.  Falling on an outstretched hand.  Trauma, such as a car accident or a fall. RISK FACTORS Risk factors for wrist fracture include:   Participating in contact and high-risk sports, such as skiing, biking, and ice skating.  Taking steroid medicines.  Smoking.  Being male.  Being Caucasian.  Drinking more than three alcoholic beverages per Fleischhacker.  Having low or lowered bone density (osteoporosis or osteopenia).  Age. Older adults have decreased bone density.  Women who have had menopause.  History of previous fractures. SIGNS AND SYMPTOMS Symptoms of wrist fractures include tenderness, bruising, and inflammation. Additionally, the wrist may hang in an odd position or appear deformed.  DIAGNOSIS Diagnosis may include:  Physical exam.  X-ray. TREATMENT Treatment depends on many factors, including the nature and location of the fracture, your age, and your activity level. Treatment for wrist fracture can be nonsurgical or surgical.  Nonsurgical Treatment A plaster cast or splint may be applied to your wrist if the bone is in a good position. If the fracture is not in good position, it may be necessary for your health care provider to realign it before applying a splint or cast. Usually, a cast or splint will be worn for several weeks.  Surgical Treatment Sometimes the position of the bone is so far out of place that surgery is required to apply a device to hold it together as it heals. Depending on the fracture, there are a number of options for holding the bone in place while it heals, such as a cast and metal pins.  HOME CARE INSTRUCTIONS  Keep your injured wrist elevated and move your fingers as much as  possible.  Do not put pressure on any part of your cast or splint. It may break.   Use a plastic bag to protect your cast or splint from water while bathing or showering. Do not lower your cast or splint into water.  Take medicines only as directed by your health care provider.  Keep your cast or splint clean and dry. If it becomes wet, damaged, or suddenly feels too tight, contact your health care provider right away.  Do not use any tobacco products including cigarettes, chewing tobacco, or electronic cigarettes. Tobacco can delay bone healing. If you need help quitting, ask your health care provider.  Keep all follow-up visits as directed by your health care provider. This is important.  Ask your health care provider if you should take supplements of calcium and vitamins C and D to promote bone healing. SEEK MEDICAL CARE IF:   Your cast or splint is damaged, breaks, or gets wet.  You have a fever.  You have chills.  You have continued severe pain or more swelling than you did before the cast was put on. SEEK IMMEDIATE MEDICAL CARE IF:   Your hand or fingernails on the injured arm turn blue or gray, or feel cold or numb.  You have decreased feeling in the fingers of your injured arm. MAKE SURE YOU:  Understand these instructions.  Will watch your condition.  Will get help right away if you are not doing well or get worse. Document Released: 01/07/2005 Document Revised:   08/14/2013 Document Reviewed: 04/17/2011 Exit[employer] Patient Information 2015 [employer]. This information is not intended to replace advice given to you by your health care provider. Make sure you discuss any questions you have with your health care provider.  
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
## 2014-11-26 NOTE — ED Notes (Signed)
MD at bedside. 
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
## 2022-10-11 ENCOUNTER — Inpatient Hospital Stay
Admit: 2022-10-11 | Discharge: 2022-10-11 | Disposition: A | Discharge status: Home / Self Care | Attending: Emergency Medicine

## 2022-10-11 ENCOUNTER — Emergency Department: Admit: 2022-10-11

## 2022-10-11 DIAGNOSIS — M720 Palmar fascial fibromatosis [Dupuytren]: Secondary | ICD-10-CM

## 2022-10-11 MED ORDER — NAPROXEN 500 MG PO TABS
500 MG | ORAL_TABLET | Freq: Two times a day (BID) | ORAL | 0 refills | Status: AC | PRN
Start: 2022-10-11 — End: ?
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
